# Patient Record
Sex: Male | Born: 1970 | Race: Black or African American | Hispanic: No | Marital: Single | Smoking: Current every day smoker
Health system: Southern US, Community
[De-identification: ages and names within clinical notes are randomized; demographics above are authoritative.]

## PROBLEM LIST (undated history)

## (undated) DIAGNOSIS — J45909 Unspecified asthma, uncomplicated: Secondary | ICD-10-CM

## (undated) DIAGNOSIS — I2510 Atherosclerotic heart disease of native coronary artery without angina pectoris: Secondary | ICD-10-CM

## (undated) DIAGNOSIS — J449 Chronic obstructive pulmonary disease, unspecified: Secondary | ICD-10-CM

## (undated) DIAGNOSIS — K5792 Diverticulitis of intestine, part unspecified, without perforation or abscess without bleeding: Secondary | ICD-10-CM

## (undated) DIAGNOSIS — I10 Essential (primary) hypertension: Secondary | ICD-10-CM

## (undated) HISTORY — DX: Diverticulitis of intestine, part unspecified, without perforation or abscess without bleeding: K57.92

## (undated) HISTORY — DX: Chronic obstructive pulmonary disease, unspecified: J44.9

## (undated) HISTORY — PX: APPENDECTOMY: SHX54

## (undated) HISTORY — DX: Unspecified asthma, uncomplicated: J45.909

## (undated) HISTORY — PX: SHOULDER SURGERY: SHX246

## (undated) HISTORY — DX: Essential (primary) hypertension: I10

## (undated) HISTORY — PX: MANDIBLE SURGERY: SHX707

## (undated) HISTORY — PX: TOTAL KNEE ARTHROPLASTY: SHX125

---

## 2013-09-15 ENCOUNTER — Ambulatory Visit: Payer: No Typology Code available for payment source | Admitting: Physical Therapy

## 2013-12-20 ENCOUNTER — Ambulatory Visit: Payer: No Typology Code available for payment source | Admitting: Cardiology

## 2014-01-31 ENCOUNTER — Ambulatory Visit (INDEPENDENT_AMBULATORY_CARE_PROVIDER_SITE_OTHER): Payer: No Typology Code available for payment source | Admitting: Cardiology

## 2014-01-31 ENCOUNTER — Encounter: Payer: Self-pay | Admitting: Cardiology

## 2014-01-31 VITALS — BP 142/102 | HR 95 | Ht 70.0 in | Wt 258.0 lb

## 2014-01-31 DIAGNOSIS — F17200 Nicotine dependence, unspecified, uncomplicated: Secondary | ICD-10-CM

## 2014-01-31 DIAGNOSIS — I10 Essential (primary) hypertension: Secondary | ICD-10-CM

## 2014-01-31 DIAGNOSIS — R079 Chest pain, unspecified: Secondary | ICD-10-CM

## 2014-01-31 DIAGNOSIS — R002 Palpitations: Secondary | ICD-10-CM | POA: Insufficient documentation

## 2014-01-31 DIAGNOSIS — R072 Precordial pain: Secondary | ICD-10-CM

## 2014-01-31 DIAGNOSIS — Z720 Tobacco use: Secondary | ICD-10-CM | POA: Insufficient documentation

## 2014-01-31 NOTE — Assessment & Plan Note (Signed)
Patient needs weight loss.

## 2014-01-31 NOTE — Progress Notes (Signed)
     HPI: 43 year old male for evaluation of chest pain and arrhythmia. Nuclear study in August of 2014 at [facility] showed no ischemia. Ejection fraction 41%. Patient has had occasional chest pain for several years. The pain is described as a burning sensation that occurs with more vigorous activities that not routine activities. He has some dyspnea on exertion but no orthopnea, PND or pedal edema. He occasionally has brief fluttering sensation. In June he apparently developed sudden onset of sustained palpitations with a heart rate of 200 by his report. EMS was called and he was admitted to [facility]. I do not have those records available. He had multiple tests but does not know the results of any. He does not recall the term atrial fibrillation or SVT. He has done well since discharge.  Current Outpatient Prescriptions  Medication Sig Dispense Refill  . aspirin 325 MG tablet Take 325 mg by mouth daily.      . Calcium Carbonate Antacid (ANTACID PO) Take by mouth daily.      Marland Kitchen. HYDROcodone-acetaminophen (NORCO) 10-325 MG per tablet Take 1 tablet by mouth every 6 (six) hours as needed.      Marland Kitchen. losartan (COZAAR) 100 MG tablet Take 100 mg by mouth daily.      . metoprolol tartrate (LOPRESSOR) 25 MG tablet Take 12.5 mg by mouth 2 (two) times daily.       No current facility-administered medications for this visit.    No Known Allergies   Past Medical History  Diagnosis Date  . Hypertension   . Asthma   . Diverticulitis   . COPD (chronic obstructive pulmonary disease)     Past Surgical History  Procedure Laterality Date  . Total knee arthroplasty Right   . Shoulder surgery    . Appendectomy    . Mandible surgery      History   Social History  . Marital Status: Single    Spouse Name: N/A    Number of Children: 4  . Years of Education: N/A   Occupational History  . Not on file.   Social History Main Topics  . Smoking status: Current Every Day Smoker  .  Smokeless tobacco: Not on file  . Alcohol Use: No  . Drug Use: No  . Sexual Activity: Not on file   Other Topics Concern  . Not on file   Social History Narrative  . No narrative on file    Family History  Problem Relation Age of Onset  . Heart attack Father   . Stroke Father     3    ROS: no fevers or chills, productive cough, hemoptysis, dysphasia, odynophagia, melena, hematochezia, dysuria, hematuria, rash, seizure activity, orthopnea, PND, pedal edema, claudication. Remaining systems are negative.  Physical Exam:   Blood pressure 142/102, pulse 95, height 5\' 10"  (1.778 m), weight 258 lb (117.028 kg).  General:  Well developed/obese in NAD Skin warm/dry Patient not depressed No peripheral clubbing Back-normal HEENT-normal/normal eyelids Neck supple/normal carotid upstroke bilaterally; no bruits; no JVD; no thyromegaly chest - CTA/ normal expansion CV - RRR/normal S1 and S2; no murmurs, rubs or gallops;  PMI nondisplaced Abdomen -NT/ND, no HSM, no mass, + bowel sounds, no bruit 2+ femoral pulses, no bruits Ext-no edema, chords, 2+ DP Neuro-grossly nonfocal  ECG Sinus rhythm at a rate of 95. No ST changes.

## 2014-01-31 NOTE — Assessment & Plan Note (Signed)
Patient counseled on discontinuing. 

## 2014-01-31 NOTE — Assessment & Plan Note (Signed)
Patient's blood pressure is elevated. I have asked him to contact us with a list of medications. We will increase based on what he is taking. I have asked him to follow his blood pressure at home.

## 2014-01-31 NOTE — Assessment & Plan Note (Signed)
Patient has exertional chest pain. I do have records of a negative nuclear study one year ago. I will await records from [facility] concerning recent evaluation. May require cardiac catheterization in the future. Continue aspirin and beta blocker.

## 2014-01-31 NOTE — Patient Instructions (Signed)
Your physician recommends that you schedule a follow-up appointment in: 4 WEEKS WITH DR CRENSHAW  TRACK BLOOD PRESSURE AND BRING THOSE TO THE NEXT APPOINTMENT

## 2014-01-31 NOTE — Assessment & Plan Note (Signed)
Patient recently admitted with what sounds to be potentially atrial fibrillation. However I have no records available. The patient does not know his medications. He does not know what he was told at [facility] about his rhythm disturbance. We will obtain all previous records. I have asked him to call us with a list of medications. We will continue his beta blocker and aspirin for now. Further recommendations based on review of records.

## 2014-02-01 ENCOUNTER — Telehealth: Payer: Self-pay | Admitting: *Deleted

## 2014-02-01 DIAGNOSIS — I10 Essential (primary) hypertension: Secondary | ICD-10-CM

## 2014-02-01 MED ORDER — LISINOPRIL 40 MG PO TABS: 40.0000 mg | ORAL_TABLET | Freq: Every day | ORAL | Status: DC

## 2014-02-01 MED ORDER — HYDROCHLOROTHIAZIDE 12.5 MG PO CAPS: 12.5000 mg | ORAL_CAPSULE | Freq: Every day | ORAL | Status: DC

## 2014-02-01 NOTE — Telephone Encounter (Signed)
Patient medication list reviewed by dr Jens Somcrenshaw, pt instructed to change lisinopril/hctz to lisinopril 40 mg once daily and hctz 12.5 mg once daily. Pt will go to the [place] office in one week for bmp. Patient voiced understanding

## 2014-02-28 ENCOUNTER — Encounter: Payer: Self-pay | Admitting: Cardiology

## 2014-02-28 ENCOUNTER — Ambulatory Visit (INDEPENDENT_AMBULATORY_CARE_PROVIDER_SITE_OTHER): Payer: No Typology Code available for payment source | Admitting: Cardiology

## 2014-02-28 ENCOUNTER — Encounter: Payer: Self-pay | Admitting: *Deleted

## 2014-02-28 ENCOUNTER — Other Ambulatory Visit: Payer: Self-pay | Admitting: Cardiology

## 2014-02-28 VITALS — BP 128/92 | HR 101 | Ht 70.0 in | Wt 269.8 lb

## 2014-02-28 DIAGNOSIS — R072 Precordial pain: Secondary | ICD-10-CM

## 2014-02-28 DIAGNOSIS — I429 Cardiomyopathy, unspecified: Secondary | ICD-10-CM | POA: Insufficient documentation

## 2014-02-28 DIAGNOSIS — R079 Chest pain, unspecified: Secondary | ICD-10-CM

## 2014-02-28 MED ORDER — METOPROLOL TARTRATE 50 MG PO TABS: 50.0000 mg | ORAL_TABLET | Freq: Two times a day (BID) | ORAL | Status: DC

## 2014-02-28 NOTE — Assessment & Plan Note (Signed)
Previous nuclear study at [facility] suggest EF 41%. Continue ARB and beta blocker. Cardiac catheterization to exclude coronary disease as described under chest pain. This will also help reassess LV function.

## 2014-02-28 NOTE — Assessment & Plan Note (Signed)
Patient with history of question SVT. We will continue to try and obtain records from [facility] He has had no further symptoms. Continue beta blocker.

## 2014-02-28 NOTE — Assessment & Plan Note (Signed)
Patient counseled on discontinuing. 

## 2014-02-28 NOTE — Assessment & Plan Note (Addendum)
Diastolic blood pressure mildly elevated. Increase Lopressor to 50 mg by mouth twice a day.

## 2014-02-28 NOTE — Assessment & Plan Note (Signed)
Patient has had intermittent chest pain for over one year. Previously he had a nuclear study that showed reduced LV function but normal perfusion based on outside records. He went to the emergency room at [facility] with chest pain but apparently had negative enzymes. I think definitive evaluation is warranted. Plan outpatient cardiac catheterization. The risks and benefits were discussed and he agrees to proceed. Continue aspirin and statin.

## 2014-02-28 NOTE — Patient Instructions (Addendum)
Your physician has recommended you make the following change in your medication:  1. INCREASE METOPROLOL TO 50 MG TWICE DAILY; NEW RX SENT IN TODAY FOR NEW DOSE  YOU HAVE BEEN SCHEDULED FOR A CATH 03/22/14 9 AM WITH DR. Excell Seltzer  LAB WORK 9/25/151; BMET, CBC W/DIFF, PT/INR  CHEST X-RAY TODAY; PRE CATH  Your physician recommends that you schedule a follow-up appointment in: 6 WEEKS WITH DR. CRENSHAW

## 2014-02-28 NOTE — Progress Notes (Signed)
      HPI: FU chest pain and arrhythmia. Nuclear study in August of 2014 at [facility] showed no ischemia. Ejection fraction 41%. In June 2014 he apparently developed sudden onset of sustained palpitations with a heart rate of 200 by his report. EMS was called and he was admitted to [facility]. I do not have those records available. He had multiple tests but does not know the results of any. He does not recall the term atrial fibrillation or SVT. Since I last saw him, he apparently was seen at [facility] 3 days ago with chest pain. Blood work and chest x-ray were checked and unremarkable. No records available. Patient states he has had a burning chest pain with exertion for approximately one year. His chest pain 3 days ago was substernal with radiation down his left upper extremity. It was not exertional, not pleuritic and not positional. He has had no pain since then. He has had no recurrent palpitations.   Current Outpatient Prescriptions  Medication Sig Dispense Refill  . aspirin 81 MG tablet Take 81 mg by mouth daily.      Marland Kitchen HYDROcodone-acetaminophen (NORCO) 10-325 MG per tablet Take 1 tablet by mouth every 6 (six) hours as needed.      Marland Kitchen losartan (COZAAR) 100 MG tablet Take 100 mg by mouth daily.      Marland Kitchen lovastatin (MEVACOR) 10 MG tablet Take 10 mg by mouth at bedtime.      . metoprolol tartrate (LOPRESSOR) 50 MG tablet Take 1 tablet (50 mg total) by mouth 2 (two) times daily.  60 tablet  11  . pantoprazole (PROTONIX) 20 MG tablet Take 20 mg by mouth daily.       No current facility-administered medications for this visit.     Past Medical History  Diagnosis Date  . Hypertension   . Asthma   . Diverticulitis   . COPD (chronic obstructive pulmonary disease)     Past Surgical History  Procedure Laterality Date  . Total knee arthroplasty Right   . Shoulder surgery    . Appendectomy    . Mandible surgery      History   Social History  . Marital  Status: Single    Spouse Name: N/A    Number of Children: 4  . Years of Education: N/A   Occupational History  . Not on file.   Social History Main Topics  . Smoking status: Current Every Day Smoker  . Smokeless tobacco: Not on file  . Alcohol Use: No  . Drug Use: No  . Sexual Activity: Not on file   Other Topics Concern  . Not on file   Social History Narrative  . No narrative on file    ROS: no fevers or chills, productive cough, hemoptysis, dysphasia, odynophagia, melena, hematochezia, dysuria, hematuria, rash, seizure activity, orthopnea, PND, pedal edema, claudication. Remaining systems are negative.  Physical Exam: Well-developed obese in no acute distress.  Skin is warm and dry.  HEENT is normal.  Neck is supple.  Chest is clear to auscultation with normal expansion.  Cardiovascular exam is regular rate and rhythm.  Abdominal exam nontender or distended. No masses palpated. Extremities show no edema. neuro grossly intact      Electrocardiogram shows sinus rhythm with no ST changes.

## 2014-03-16 ENCOUNTER — Other Ambulatory Visit: Payer: No Typology Code available for payment source

## 2014-03-16 ENCOUNTER — Ambulatory Visit (HOSPITAL_BASED_OUTPATIENT_CLINIC_OR_DEPARTMENT_OTHER)
Admission: RE | Admit: 2014-03-16 | Discharge: 2014-03-16 | Disposition: A | Discharge status: Home / Self Care | Payer: No Typology Code available for payment source | Source: Ambulatory Visit | Attending: Cardiology | Admitting: Cardiology

## 2014-03-16 DIAGNOSIS — R079 Chest pain, unspecified: Secondary | ICD-10-CM

## 2014-03-16 LAB — CBC WITH DIFFERENTIAL/PLATELET
BASOS PCT: 0 % (ref 0–1)
Basophils Absolute: 0 10*3/uL (ref 0.0–0.1)
EOS ABS: 0.1 10*3/uL (ref 0.0–0.7)
Eosinophils Relative: 2 % (ref 0–5)
HEMATOCRIT: 40.6 % (ref 39.0–52.0)
Hemoglobin: 13.7 g/dL (ref 13.0–17.0)
Lymphocytes Relative: 40 % (ref 12–46)
Lymphs Abs: 2.8 10*3/uL (ref 0.7–4.0)
MCH: 25 pg — AB (ref 26.0–34.0)
MCHC: 33.7 g/dL (ref 30.0–36.0)
MCV: 74.1 fL — ABNORMAL LOW (ref 78.0–100.0)
MONO ABS: 0.3 10*3/uL (ref 0.1–1.0)
Monocytes Relative: 5 % (ref 3–12)
Neutro Abs: 3.7 10*3/uL (ref 1.7–7.7)
Neutrophils Relative %: 53 % (ref 43–77)
Platelets: 273 10*3/uL (ref 150–400)
RBC: 5.48 MIL/uL (ref 4.22–5.81)
RDW: 15.9 % — AB (ref 11.5–15.5)
WBC: 6.9 10*3/uL (ref 4.0–10.5)

## 2014-03-17 LAB — BASIC METABOLIC PANEL
BUN: 16 mg/dL (ref 6–23)
CHLORIDE: 104 meq/L (ref 96–112)
CO2: 24 meq/L (ref 19–32)
Calcium: 9.4 mg/dL (ref 8.4–10.5)
Creat: 1.07 mg/dL (ref 0.50–1.35)
GLUCOSE: 95 mg/dL (ref 70–99)
Potassium: 4.2 mEq/L (ref 3.5–5.3)
SODIUM: 139 meq/L (ref 135–145)

## 2014-03-17 LAB — PROTIME-INR
INR: 0.97 (ref <1.50)
Prothrombin Time: 12.9 seconds (ref 11.6–15.2)

## 2014-03-19 ENCOUNTER — Encounter (HOSPITAL_COMMUNITY): Payer: Self-pay | Admitting: Pharmacy Technician

## 2014-03-22 ENCOUNTER — Encounter (HOSPITAL_COMMUNITY)
Admission: RE | Disposition: A | Discharge status: Home / Self Care | Payer: Self-pay | Source: Ambulatory Visit | Attending: Cardiovascular Disease

## 2014-03-22 ENCOUNTER — Ambulatory Visit (HOSPITAL_COMMUNITY)
Admission: RE | Admit: 2014-03-22 | Discharge: 2014-03-22 | Disposition: A | Discharge status: Home / Self Care | Payer: No Typology Code available for payment source | Source: Ambulatory Visit | Attending: Cardiovascular Disease | Admitting: Cardiovascular Disease

## 2014-03-22 DIAGNOSIS — Z720 Tobacco use: Secondary | ICD-10-CM | POA: Insufficient documentation

## 2014-03-22 DIAGNOSIS — Z79899 Other long term (current) drug therapy: Secondary | ICD-10-CM | POA: Insufficient documentation

## 2014-03-22 DIAGNOSIS — I2510 Atherosclerotic heart disease of native coronary artery without angina pectoris: Secondary | ICD-10-CM | POA: Insufficient documentation

## 2014-03-22 DIAGNOSIS — J449 Chronic obstructive pulmonary disease, unspecified: Secondary | ICD-10-CM | POA: Insufficient documentation

## 2014-03-22 DIAGNOSIS — I10 Essential (primary) hypertension: Secondary | ICD-10-CM | POA: Insufficient documentation

## 2014-03-22 DIAGNOSIS — J45909 Unspecified asthma, uncomplicated: Secondary | ICD-10-CM | POA: Insufficient documentation

## 2014-03-22 DIAGNOSIS — Z7982 Long term (current) use of aspirin: Secondary | ICD-10-CM | POA: Insufficient documentation

## 2014-03-22 DIAGNOSIS — R072 Precordial pain: Secondary | ICD-10-CM

## 2014-03-22 HISTORY — PX: LEFT HEART CATHETERIZATION WITH CORONARY ANGIOGRAM: SHX5451

## 2014-03-22 SURGERY — LEFT HEART CATHETERIZATION WITH CORONARY ANGIOGRAM
Anesthesia: LOCAL

## 2014-03-22 MED ORDER — ACETAMINOPHEN 325 MG PO TABS: 650.0000 mg | ORAL_TABLET | ORAL | Status: DC | PRN

## 2014-03-22 MED ORDER — SODIUM CHLORIDE 0.9 % IJ SOLN: 3.0000 mL | Freq: Two times a day (BID) | INTRAMUSCULAR | Status: DC

## 2014-03-22 MED ORDER — LIDOCAINE HCL (PF) 1 % IJ SOLN
INTRAMUSCULAR | Status: AC
  Filled 2014-03-22: qty 30

## 2014-03-22 MED ORDER — FENTANYL CITRATE 0.05 MG/ML IJ SOLN
INTRAMUSCULAR | Status: AC
  Filled 2014-03-22: qty 2

## 2014-03-22 MED ORDER — SODIUM CHLORIDE 0.9 % IV SOLN: 250.0000 mL | INTRAVENOUS | Status: DC | PRN

## 2014-03-22 MED ORDER — SODIUM CHLORIDE 0.9 % IV SOLN: 1.0000 mL/kg/h | INTRAVENOUS | Status: DC

## 2014-03-22 MED ORDER — SODIUM CHLORIDE 0.9 % IJ SOLN: 3.0000 mL | INTRAMUSCULAR | Status: DC | PRN

## 2014-03-22 MED ORDER — ONDANSETRON HCL 4 MG/2ML IJ SOLN: 4.0000 mg | Freq: Four times a day (QID) | INTRAMUSCULAR | Status: DC | PRN

## 2014-03-22 MED ORDER — HEPARIN SODIUM (PORCINE) 1000 UNIT/ML IJ SOLN
INTRAMUSCULAR | Status: AC
  Filled 2014-03-22: qty 1

## 2014-03-22 MED ORDER — VERAPAMIL HCL 2.5 MG/ML IV SOLN
INTRAVENOUS | Status: AC
  Filled 2014-03-22: qty 2

## 2014-03-22 MED ORDER — SODIUM CHLORIDE 0.9 % IV SOLN
1.0000 mL/kg/h | INTRAVENOUS | Status: DC
  Administered 2014-03-22: 1 mL/kg/h via INTRAVENOUS

## 2014-03-22 MED ORDER — ASPIRIN 81 MG PO CHEW
81.0000 mg | CHEWABLE_TABLET | ORAL | Status: AC
  Administered 2014-03-22: 81 mg via ORAL

## 2014-03-22 MED ORDER — MIDAZOLAM HCL 2 MG/2ML IJ SOLN
INTRAMUSCULAR | Status: AC
  Filled 2014-03-22: qty 2

## 2014-03-22 MED ORDER — HEPARIN (PORCINE) IN NACL 2-0.9 UNIT/ML-% IJ SOLN
INTRAMUSCULAR | Status: AC
  Filled 2014-03-22: qty 500

## 2014-03-22 MED ORDER — ASPIRIN 81 MG PO CHEW
CHEWABLE_TABLET | ORAL | Status: AC
  Filled 2014-03-22: qty 1

## 2014-03-22 NOTE — Interval H&P Note (Signed)
History and Physical Interval Note:  03/22/2014 7:33 AM  Margit BandaJames Lewis Lempke  has presented today for surgery, with the diagnosis of cp  The various methods of treatment have been discussed with the patient and family. After consideration of risks, benefits and other options for treatment, the patient has consented to  Procedure(s): LEFT HEART CATHETERIZATION WITH CORONARY ANGIOGRAM (N/A) as a surgical intervention .  The patient's history has been reviewed, patient examined, no change in status, stable for surgery.  I have reviewed the patient's chart and labs.  Questions were answered to the patient's satisfaction.    Cath Lab Visit (complete for each Cath Lab visit)  Clinical Evaluation Leading to the Procedure:   ACS: No.  Non-ACS:    Anginal Classification: CCS III  Anti-ischemic medical therapy: Minimal Therapy (1 class of medications)  Non-Invasive Test Results: No non-invasive testing performed  Prior CABG: No previous CABG        Tonny BollmanMichael Sevanna Ballengee

## 2014-03-22 NOTE — CV Procedure (Signed)
    Cardiac Catheterization Procedure Note  Name: Adam Moses MRN: [id number] DOB: 1971/02/20  Procedure: Left Heart Cath, Selective Coronary Angiography, LV angiography  Indication: Chest pain, multiple CV risk factors   Procedural Details: The right wrist was prepped, draped, and anesthetized with 1% lidocaine. Using the modified Seldinger technique, a 5/6 French Slender sheath was introduced into the right radial artery. 3 mg of verapamil was administered through the sheath, weight-based unfractionated heparin was administered intravenously. Standard Judkins catheters were used for selective coronary angiography and left ventriculography. Catheter exchanges were performed over an exchange length guidewire. There were no immediate procedural complications. A TR band was used for radial hemostasis at the completion of the procedure.  The patient was transferred to the post catheterization recovery area for further monitoring.  Procedural Findings: Hemodynamics: AO 118/77 LV 109/8  Coronary angiography: Coronary dominance: right  Left mainstem: The left main is patent. There is no obstructive disease.  Left anterior descending (LAD): The LAD is patent. There are minor irregularities but no significant stenoses noted. The vessel wraps around the LV apex the  Left circumflex (LCx): The intermediate branch is patent. There are diffuse irregularities. The vessel divides into twin vessels in its midportion. The AV circumflex is small.  Right coronary artery (RCA): The RCA has diffuse proximal irregularity with about 30% stenosis. The mid and distal vessel are widely patent. The PDA and PLA branches are small with minor diffuse  Left ventriculography: Left ventricular systolic function is low-normal, LVEF is estimated at 50-55 %, there is no significant mitral regurgitation   Estimated Blood Loss: Minimal  Final Conclusions:   1. Nonobstructive coronary artery disease 2. Low normal  LV systolic function  Recommendations:  Suspect noncardiac chest pain. However, the patient does have coronary atherosclerosis at age 43. He will require aggressive risk reduction and at a minimum would recommend aspirin, a statin drug, and we discussed tobacco cessation.  Tonny BollmanMichael Kiele Heavrin MD, [facility] 03/22/2014, 10:07 AM

## 2014-03-22 NOTE — H&P (View-Only) (Signed)
      HPI: FU chest pain and arrhythmia. Nuclear study in August of 2014 at [facility] showed no ischemia. Ejection fraction 41%. In June 2014 he apparently developed sudden onset of sustained palpitations with a heart rate of 200 by his report. EMS was called and he was admitted to [facility]. I do not have those records available. He had multiple tests but does not know the results of any. He does not recall the term atrial fibrillation or SVT. Since I last saw him, he apparently was seen [facility] 3 days ago with chest pain. Blood work and chest x-ray were checked and unremarkable. No records available. Patient states he has had a burning chest pain with exertion for approximately one year. His chest pain 3 days ago was substernal with radiation down his left upper extremity. It was not exertional, not pleuritic and not positional. He has had no pain since then. He has had no recurrent palpitations.   Current Outpatient Prescriptions  Medication Sig Dispense Refill  . aspirin 81 MG tablet Take 81 mg by mouth daily.      Marland Kitchen. HYDROcodone-acetaminophen (NORCO) 10-325 MG per tablet Take 1 tablet by mouth every 6 (six) hours as needed.      Marland Kitchen. losartan (COZAAR) 100 MG tablet Take 100 mg by mouth daily.      Marland Kitchen. lovastatin (MEVACOR) 10 MG tablet Take 10 mg by mouth at bedtime.      . metoprolol tartrate (LOPRESSOR) 50 MG tablet Take 1 tablet (50 mg total) by mouth 2 (two) times daily.  60 tablet  11  . pantoprazole (PROTONIX) 20 MG tablet Take 20 mg by mouth daily.       No current facility-administered medications for this visit.     Past Medical History  Diagnosis Date  . Hypertension   . Asthma   . Diverticulitis   . COPD (chronic obstructive pulmonary disease)     Past Surgical History  Procedure Laterality Date  . Total knee arthroplasty Right   . Shoulder surgery    . Appendectomy    . Mandible surgery      History   Social History  . Marital  Status: Single    Spouse Name: N/A    Number of Children: 4  . Years of Education: N/A   Occupational History  . Not on file.   Social History Main Topics  . Smoking status: Current Every Day Smoker  . Smokeless tobacco: Not on file  . Alcohol Use: No  . Drug Use: No  . Sexual Activity: Not on file   Other Topics Concern  . Not on file   Social History Narrative  . No narrative on file    ROS: no fevers or chills, productive cough, hemoptysis, dysphasia, odynophagia, melena, hematochezia, dysuria, hematuria, rash, seizure activity, orthopnea, PND, pedal edema, claudication. Remaining systems are negative.  Physical Exam: Well-developed obese in no acute distress.  Skin is warm and dry.  HEENT is normal.  Neck is supple.  Chest is clear to auscultation with normal expansion.  Cardiovascular exam is regular rate and rhythm.  Abdominal exam nontender or distended. No masses palpated. Extremities show no edema. neuro grossly intact      Electrocardiogram shows sinus rhythm with no ST changes.

## 2014-03-22 NOTE — Discharge Instructions (Signed)
Radial Site Care °Refer to this sheet in the next few weeks. These instructions provide you with information on caring for yourself after your procedure. Your caregiver may also give you more specific instructions. Your treatment has been planned according to current medical practices, but problems sometimes occur. Call your caregiver if you have any problems or questions after your procedure. °HOME CARE INSTRUCTIONS °· You may shower the day after the procedure. Remove the bandage (dressing) and gently wash the site with plain soap and water. Gently pat the site dry. °· Do not apply powder or lotion to the site. °· Do not submerge the affected site in water for 3 to 5 days. °· Inspect the site at least twice daily. °· Do not flex or bend the affected arm for 24 hours. °· No lifting over 5 pounds (2.3 kg) for 5 days after your procedure. °· Do not drive home if you are discharged the same day of the procedure. Have someone else drive you. °· You may drive 24 hours after the procedure unless otherwise instructed by your caregiver. °· Do not operate machinery or power tools for 24 hours. °· A responsible adult should be with you for the first 24 hours after you arrive home. °What to expect: °· Any bruising will usually fade within 1 to 2 weeks. °· Blood that collects in the tissue (hematoma) may be painful to the touch. It should usually decrease in size and tenderness within 1 to 2 weeks. °SEEK IMMEDIATE MEDICAL CARE IF: °· You have unusual pain at the radial site. °· You have redness, warmth, swelling, or pain at the radial site. °· You have drainage (other than a small amount of blood on the dressing). °· You have chills. °· You have a fever or persistent symptoms for more than 72 hours. °· You have a fever and your symptoms suddenly get worse. °· Your arm becomes pale, cool, tingly, or numb. °· You have heavy bleeding from the site. Hold pressure on the site. °Document Released: 07/11/2010 Document Revised:  08/31/2011 Document Reviewed: 07/11/2010 °ExitCare® Patient Information ©2015 [employer]. This information is not intended to replace advice given to you by your health care provider. Make sure you discuss any questions you have with your health care provider. ° °

## 2014-04-02 ENCOUNTER — Telehealth: Payer: Self-pay | Admitting: Cardiology

## 2014-04-02 NOTE — Telephone Encounter (Signed)
Records rec From [facility], Sent [facility] to [employer]Debra Mathis   10.12.15./km

## 2014-04-11 ENCOUNTER — Encounter: Payer: Self-pay | Admitting: Cardiology

## 2014-04-11 ENCOUNTER — Ambulatory Visit (INDEPENDENT_AMBULATORY_CARE_PROVIDER_SITE_OTHER): Payer: No Typology Code available for payment source | Admitting: Cardiology

## 2014-04-11 VITALS — BP 150/100 | HR 75 | Ht 70.0 in | Wt 273.0 lb

## 2014-04-11 DIAGNOSIS — E785 Hyperlipidemia, unspecified: Secondary | ICD-10-CM

## 2014-04-11 DIAGNOSIS — I2583 Coronary atherosclerosis due to lipid rich plaque: Principal | ICD-10-CM

## 2014-04-11 DIAGNOSIS — I471 Supraventricular tachycardia, unspecified: Secondary | ICD-10-CM

## 2014-04-11 DIAGNOSIS — I10 Essential (primary) hypertension: Secondary | ICD-10-CM

## 2014-04-11 DIAGNOSIS — Z720 Tobacco use: Secondary | ICD-10-CM

## 2014-04-11 DIAGNOSIS — I2510 Atherosclerotic heart disease of native coronary artery without angina pectoris: Secondary | ICD-10-CM

## 2014-04-11 MED ORDER — PANTOPRAZOLE SODIUM 20 MG PO TBEC: 20.0000 mg | DELAYED_RELEASE_TABLET | Freq: Every day | ORAL | Status: DC

## 2014-04-11 MED ORDER — ATORVASTATIN CALCIUM 40 MG PO TABS
40.0000 mg | ORAL_TABLET | Freq: Every day | ORAL | Status: AC
Start: 2014-04-11 — End: 2015-04-11

## 2014-04-11 MED ORDER — METOPROLOL TARTRATE 50 MG PO TABS: 50.0000 mg | ORAL_TABLET | Freq: Two times a day (BID) | ORAL | Status: DC

## 2014-04-11 MED ORDER — HYDROCHLOROTHIAZIDE 12.5 MG PO CAPS: 12.5000 mg | ORAL_CAPSULE | Freq: Every day | ORAL | Status: DC

## 2014-04-11 NOTE — Assessment & Plan Note (Signed)
Patient is noted to have plaque in his coronaries on catheterization. Discontinue Mevacor. Begin Lipitor 40 mg daily. Check lipids and liver in 4 weeks.

## 2014-04-11 NOTE — Assessment & Plan Note (Signed)
Patient sounds to be having episodes of SVT requiring adenosine. I do not have records available from [facility]. I will obtain rhythm strips and records. If SVT is documented he would like definitive therapy. We will refer to electrophysiology consideration of ablation. Continue beta blocker.

## 2014-04-11 NOTE — Assessment & Plan Note (Signed)
Patient counseled on weight loss. 

## 2014-04-11 NOTE — Patient Instructions (Signed)
Your physician recommends that you schedule a follow-up appointment in: 3 MONTHS WITH DR CRENSHAW  STOP LOVASTATIN  START ATORVASTATIN 40 MG ONCE DAILY  Your physician recommends that you return for lab work in: 4 WEEKS = DO NOT EAT PRIOR TO LAB WORK  INCREASE METOPROLOL TO 50 MG TWICE DAILY= 2 OF THE 25 MG TABLETS TWICE DAILY  START HCTZ 12.5 MG ONCE DAILY  Your physician recommends that you return for lab work in: ONE WEEK=BMP

## 2014-04-11 NOTE — Progress Notes (Signed)
      HPI: FU chest pain and arrhythmia. Nuclear study in August of 2014 at [facility] showed no ischemia. Ejection fraction 41%. In June 2014 he apparently developed sudden onset of sustained palpitations with a heart rate of 200 by his report. EMS was called and he was admitted to [facility]. I do not have those records available. He had multiple tests but does not know the results of any. He does not recall the term atrial fibrillation or SVT. The patient continued to have chest pain. Cardiac catheterization in October 2015 showed irregularities but no triple coronary disease. Ejection fraction 50-55%. Since last seen, He denies dyspnea on exertion, orthopnea, PND, pedal edema or syncope. Occasional indigestion but no other chest pain. He had what sounds to be another episode of SVT. He describes sudden onset of heart racing with a heart rate of 220. He was seen at [facility] and sounds to have been given adenosine. He converted. I do not have those records available.   Current Outpatient Prescriptions  Medication Sig Dispense Refill  . aspirin EC 81 MG tablet Take 81 mg by mouth daily.      Marland Kitchen. HYDROcodone-acetaminophen (NORCO) 10-325 MG per tablet Take 1 tablet by mouth every 6 (six) hours as needed (pain).       Marland Kitchen. losartan (COZAAR) 100 MG tablet Take 100 mg by mouth daily.      Marland Kitchen. lovastatin (MEVACOR) 10 MG tablet Take 10 mg by mouth at bedtime.      . metoprolol tartrate (LOPRESSOR) 25 MG tablet Take 25 mg by mouth 2 (two) times daily.      . pantoprazole (PROTONIX) 20 MG tablet Take 20 mg by mouth daily.       No current facility-administered medications for this visit.     Past Medical History  Diagnosis Date  . Hypertension   . Asthma   . Diverticulitis   . COPD (chronic obstructive pulmonary disease)     Past Surgical History  Procedure Laterality Date  . Total knee arthroplasty Right   . Shoulder surgery    . Appendectomy    . Mandible surgery       History   Social History  . Marital Status: Single    Spouse Name: N/A    Number of Children: 4  . Years of Education: N/A   Occupational History  . Not on file.   Social History Main Topics  . Smoking status: Current Every Day Smoker  . Smokeless tobacco: Not on file  . Alcohol Use: No  . Drug Use: No  . Sexual Activity: Not on file   Other Topics Concern  . Not on file   Social History Narrative  . No narrative on file    ROS: no fevers or chills, productive cough, hemoptysis, dysphasia, odynophagia, melena, hematochezia, dysuria, hematuria, rash, seizure activity, orthopnea, PND, pedal edema, claudication. Remaining systems are negative.  Physical Exam: Well-developed obese in no acute distress.  Skin is warm and dry.  HEENT is normal.  Neck is supple.  Chest is clear to auscultation with normal expansion.  Cardiovascular exam is regular rate and rhythm.  Abdominal exam nontender or distended. No masses palpated. Extremities show no edema. neuro grossly intact

## 2014-04-11 NOTE — Assessment & Plan Note (Signed)
Patient counseled on discontinuing. 

## 2014-04-11 NOTE — Assessment & Plan Note (Signed)
Blood pressure is elevated. Add HCTZ 12.5 mg daily and increase metoprolol to 50 mg by mouth twice a day. Check potassium and renal function in one week.

## 2014-04-11 NOTE — Assessment & Plan Note (Signed)
Patient has mild coronary disease on recent catheterization. Continue aspirin and statin. Discussed lifestyle modification.

## 2014-04-18 LAB — BASIC METABOLIC PANEL WITH GFR
BUN: 14 mg/dL (ref 6–23)
CALCIUM: 9.3 mg/dL (ref 8.4–10.5)
CO2: 24 mEq/L (ref 19–32)
Chloride: 106 mEq/L (ref 96–112)
Creat: 1.02 mg/dL (ref 0.50–1.35)
GFR, Est Non African American: 88 mL/min
GLUCOSE: 106 mg/dL — AB (ref 70–99)
POTASSIUM: 4.1 meq/L (ref 3.5–5.3)
SODIUM: 140 meq/L (ref 135–145)

## 2014-04-18 LAB — LIPID PANEL
CHOL/HDL RATIO: 4.4 ratio
CHOLESTEROL: 133 mg/dL (ref 0–200)
HDL: 30 mg/dL — AB (ref >39)
LDL Cholesterol: 81 mg/dL (ref 0–99)
Triglycerides: 110 mg/dL (ref <150)
VLDL: 22 mg/dL (ref 0–40)

## 2014-04-18 LAB — HEPATIC FUNCTION PANEL
ALBUMIN: 4.2 g/dL (ref 3.5–5.2)
ALT: 19 U/L (ref 0–53)
AST: 17 U/L (ref 0–37)
Alkaline Phosphatase: 41 U/L (ref 39–117)
BILIRUBIN DIRECT: 0.1 mg/dL (ref 0.0–0.3)
Indirect Bilirubin: 0.2 mg/dL (ref 0.2–1.2)
Total Bilirubin: 0.3 mg/dL (ref 0.2–1.2)
Total Protein: 7.5 g/dL (ref 6.0–8.3)

## 2014-04-19 ENCOUNTER — Encounter: Payer: Self-pay | Admitting: *Deleted

## 2014-05-31 ENCOUNTER — Encounter (HOSPITAL_COMMUNITY): Payer: Self-pay | Admitting: Cardiovascular Disease

## 2014-06-01 ENCOUNTER — Encounter: Payer: Self-pay | Admitting: *Deleted

## 2014-07-18 ENCOUNTER — Ambulatory Visit (INDEPENDENT_AMBULATORY_CARE_PROVIDER_SITE_OTHER): Payer: No Typology Code available for payment source | Admitting: Cardiology

## 2014-07-18 ENCOUNTER — Encounter: Payer: Self-pay | Admitting: Cardiology

## 2014-07-18 VITALS — BP 122/90 | HR 81 | Ht 70.0 in | Wt 272.0 lb

## 2014-07-18 DIAGNOSIS — I2583 Coronary atherosclerosis due to lipid rich plaque: Principal | ICD-10-CM

## 2014-07-18 DIAGNOSIS — I2510 Atherosclerotic heart disease of native coronary artery without angina pectoris: Secondary | ICD-10-CM

## 2014-07-18 DIAGNOSIS — Z720 Tobacco use: Secondary | ICD-10-CM

## 2014-07-18 DIAGNOSIS — E785 Hyperlipidemia, unspecified: Secondary | ICD-10-CM

## 2014-07-18 DIAGNOSIS — I10 Essential (primary) hypertension: Secondary | ICD-10-CM

## 2014-07-18 DIAGNOSIS — I471 Supraventricular tachycardia, unspecified: Secondary | ICD-10-CM

## 2014-07-18 NOTE — Assessment & Plan Note (Signed)
Long discussion today concerning options. We did obtain his electro[facility]ogram from [facility] demonstrating his previous SVT. 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 His symptoms have improved with higher dose metoprolol. He would like to continue conservative measures if possible. We will therefore continue his beta blocker. If he has breakthrough episodes in the future we will refer to electrophysiology for consideration of ablation.

## 2014-07-18 NOTE — Patient Instructions (Signed)
Your physician wants you to follow-up in: 6 MONTHS WITH DR CRENSHAW You will receive a reminder letter in the mail two months in advance. If you don't receive a letter, please call our office to schedule the follow-up appointment.  

## 2014-07-18 NOTE — Assessment & Plan Note (Signed)
Patient counseled on discontinuing. 

## 2014-07-18 NOTE — Progress Notes (Signed)
      HPI: FU SVT. Nuclear study in August of 2014 at [facility] showed no ischemia. Ejection fraction 41%. Patient has previously been treated with adenosine for SVT at [facility]. Echocardiogram April 2015 showed normal LV function and mild left ventricular hypertrophy. The patient continued to have chest pain. Cardiac catheterization in October 2015 showed irregularities but no significant coronary disease. Ejection fraction 50-55%. Since last seen, he has an occasional episode where he feels short of breath for 1-2 seconds and mild dyspnea on exertion. No orthopnea, PND, pedal edema, exertional chest pain or syncope. No SVT since last seen.  Current Outpatient Prescriptions  Medication Sig Dispense Refill  . aspirin EC 81 MG tablet Take 81 mg by mouth daily.    Marland Kitchen. atorvastatin (LIPITOR) 40 MG tablet Take 1 tablet (40 mg total) by mouth daily. 30 tablet 11  . HYDROcodone-acetaminophen (NORCO) 10-325 MG per tablet Take 1 tablet by mouth every 6 (six) hours as needed (pain).     Marland Kitchen. losartan (COZAAR) 100 MG tablet Take 100 mg by mouth daily.    . metoprolol tartrate (LOPRESSOR) 50 MG tablet Take 1 tablet (50 mg total) by mouth 2 (two) times daily. 60 tablet 11  . pantoprazole (PROTONIX) 20 MG tablet Take 1 tablet (20 mg total) by mouth daily. 30 tablet 11   No current facility-administered medications for this visit.     Past Medical History  Diagnosis Date  . Hypertension   . Asthma   . Diverticulitis   . COPD (chronic obstructive pulmonary disease)     Past Surgical History  Procedure Laterality Date  . Total knee arthroplasty Right   . Shoulder surgery    . Appendectomy    . Mandible surgery    . Left heart catheterization with coronary angiogram N/A 03/22/2014    Procedure: LEFT HEART CATHETERIZATION WITH CORONARY ANGIOGRAM;  Surgeon: Micheline ChapmanMichael D Cooper, MD;  Location: [facility] CATH LAB;  Service: Cardiovascular;  Laterality: N/A;    History   Social History  . Marital  Status: Single    Spouse Name: N/A    Number of Children: 4  . Years of Education: N/A   Occupational History  . Not on file.   Social History Main Topics  . Smoking status: Current Every Day Smoker  . Smokeless tobacco: Not on file  . Alcohol Use: No  . Drug Use: No  . Sexual Activity: Not on file   Other Topics Concern  . Not on file   Social History Narrative    ROS: no fevers or chills, productive cough, hemoptysis, dysphasia, odynophagia, melena, hematochezia, dysuria, hematuria, rash, seizure activity, orthopnea, PND, pedal edema, claudication. Remaining systems are negative.  Physical Exam: Well-developed well-nourished in no acute distress.  Skin is warm and dry.  HEENT is normal.  Neck is supple.  Chest is clear to auscultation with normal expansion.  Cardiovascular exam is regular rate and rhythm.  Abdominal exam nontender or distended. No masses palpated. Extremities show no edema. neuro grossly intact

## 2014-07-18 NOTE — Assessment & Plan Note (Signed)
Continue statin. 

## 2014-07-18 NOTE — Assessment & Plan Note (Signed)
Blood pressure controlled. Continue present medications. 

## 2014-07-18 NOTE — Assessment & Plan Note (Signed)
Mild on previous catheterization. Continue aspirin and statin. 

## 2014-11-07 IMAGING — CR DG CHEST 2V
2 series · 2 of 2 positions shown · non-contrast
Comparison: 03/15/14

CLINICAL DATA: Mid sternal chest pain.

EXAM:
CHEST  2 VIEW

[w chest pa]
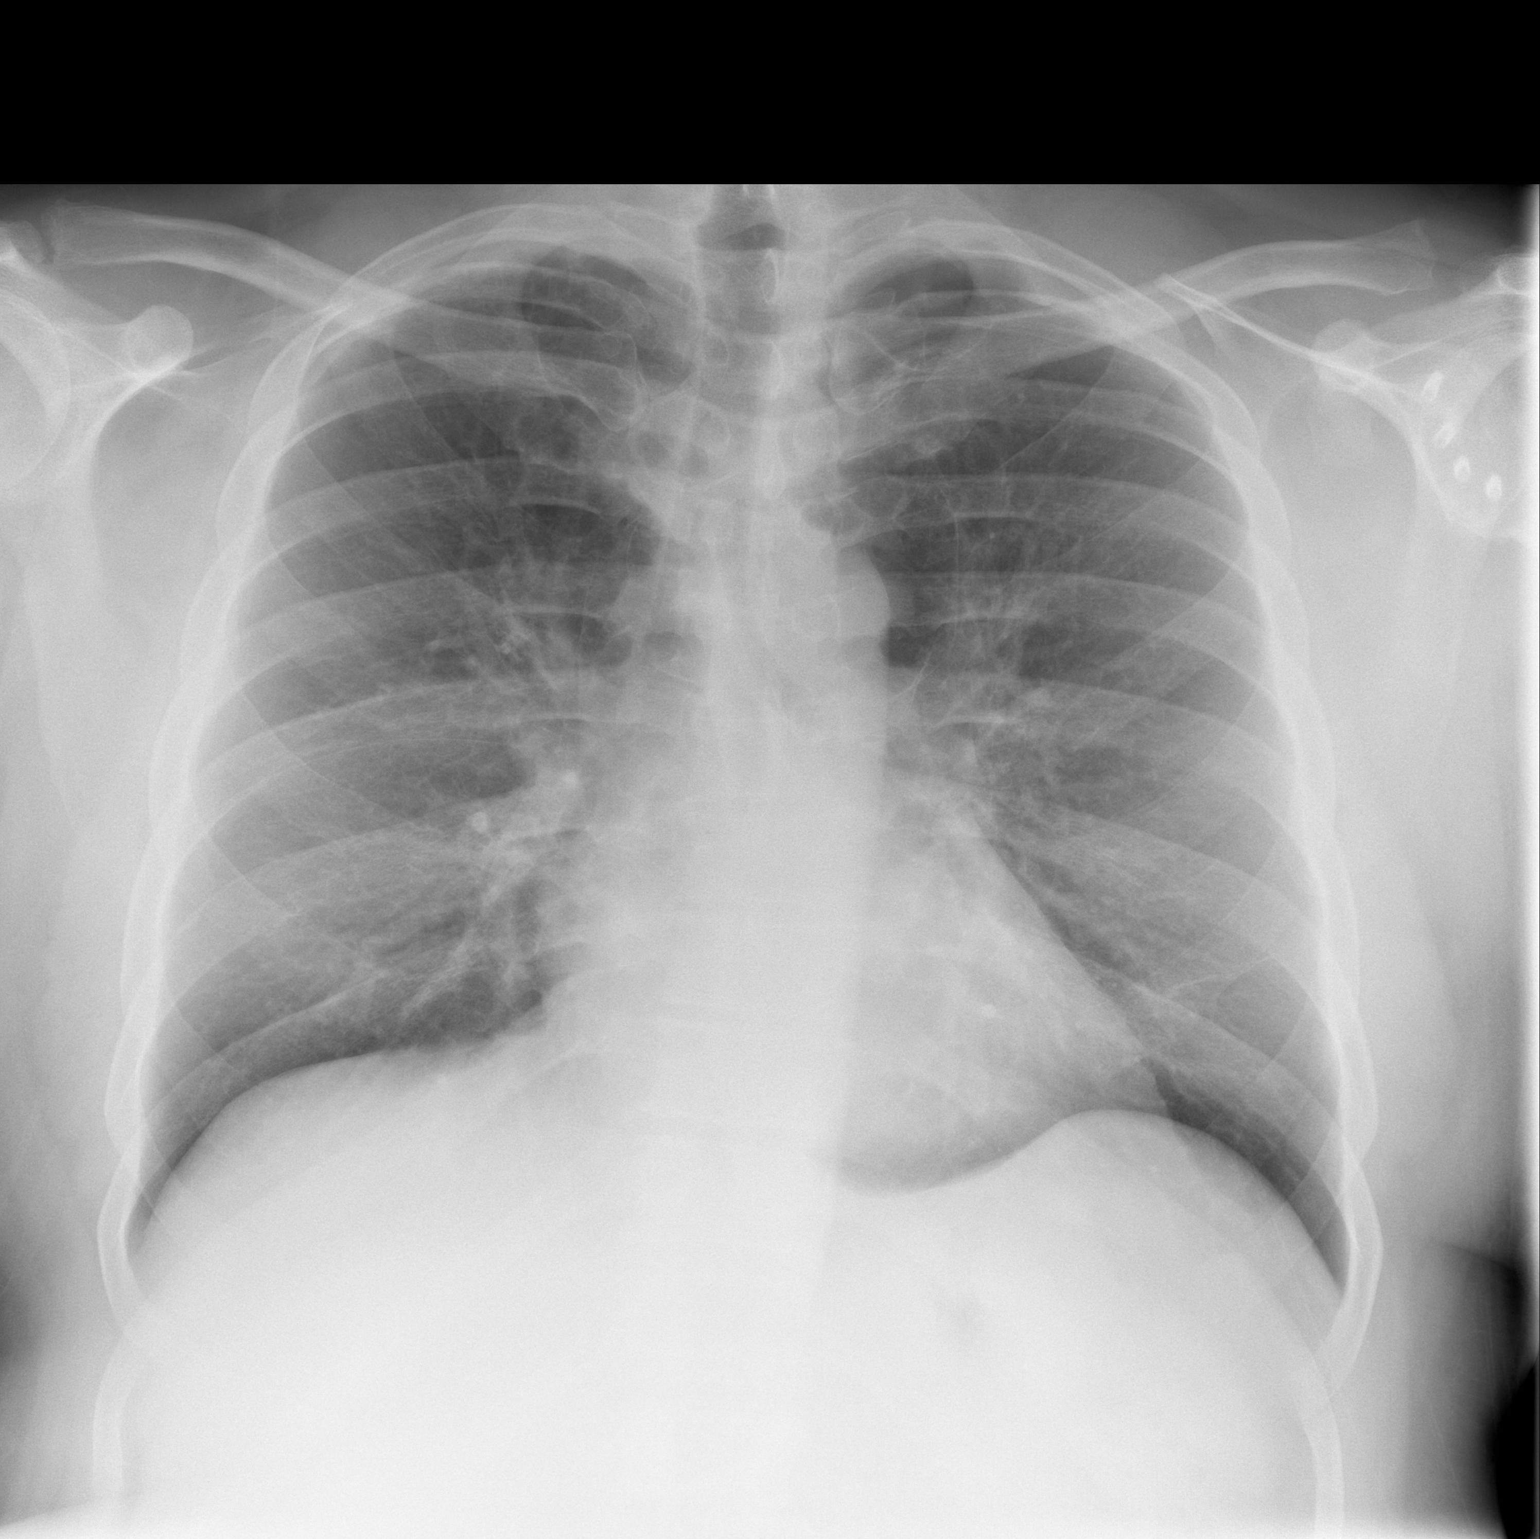

[w chest lat]
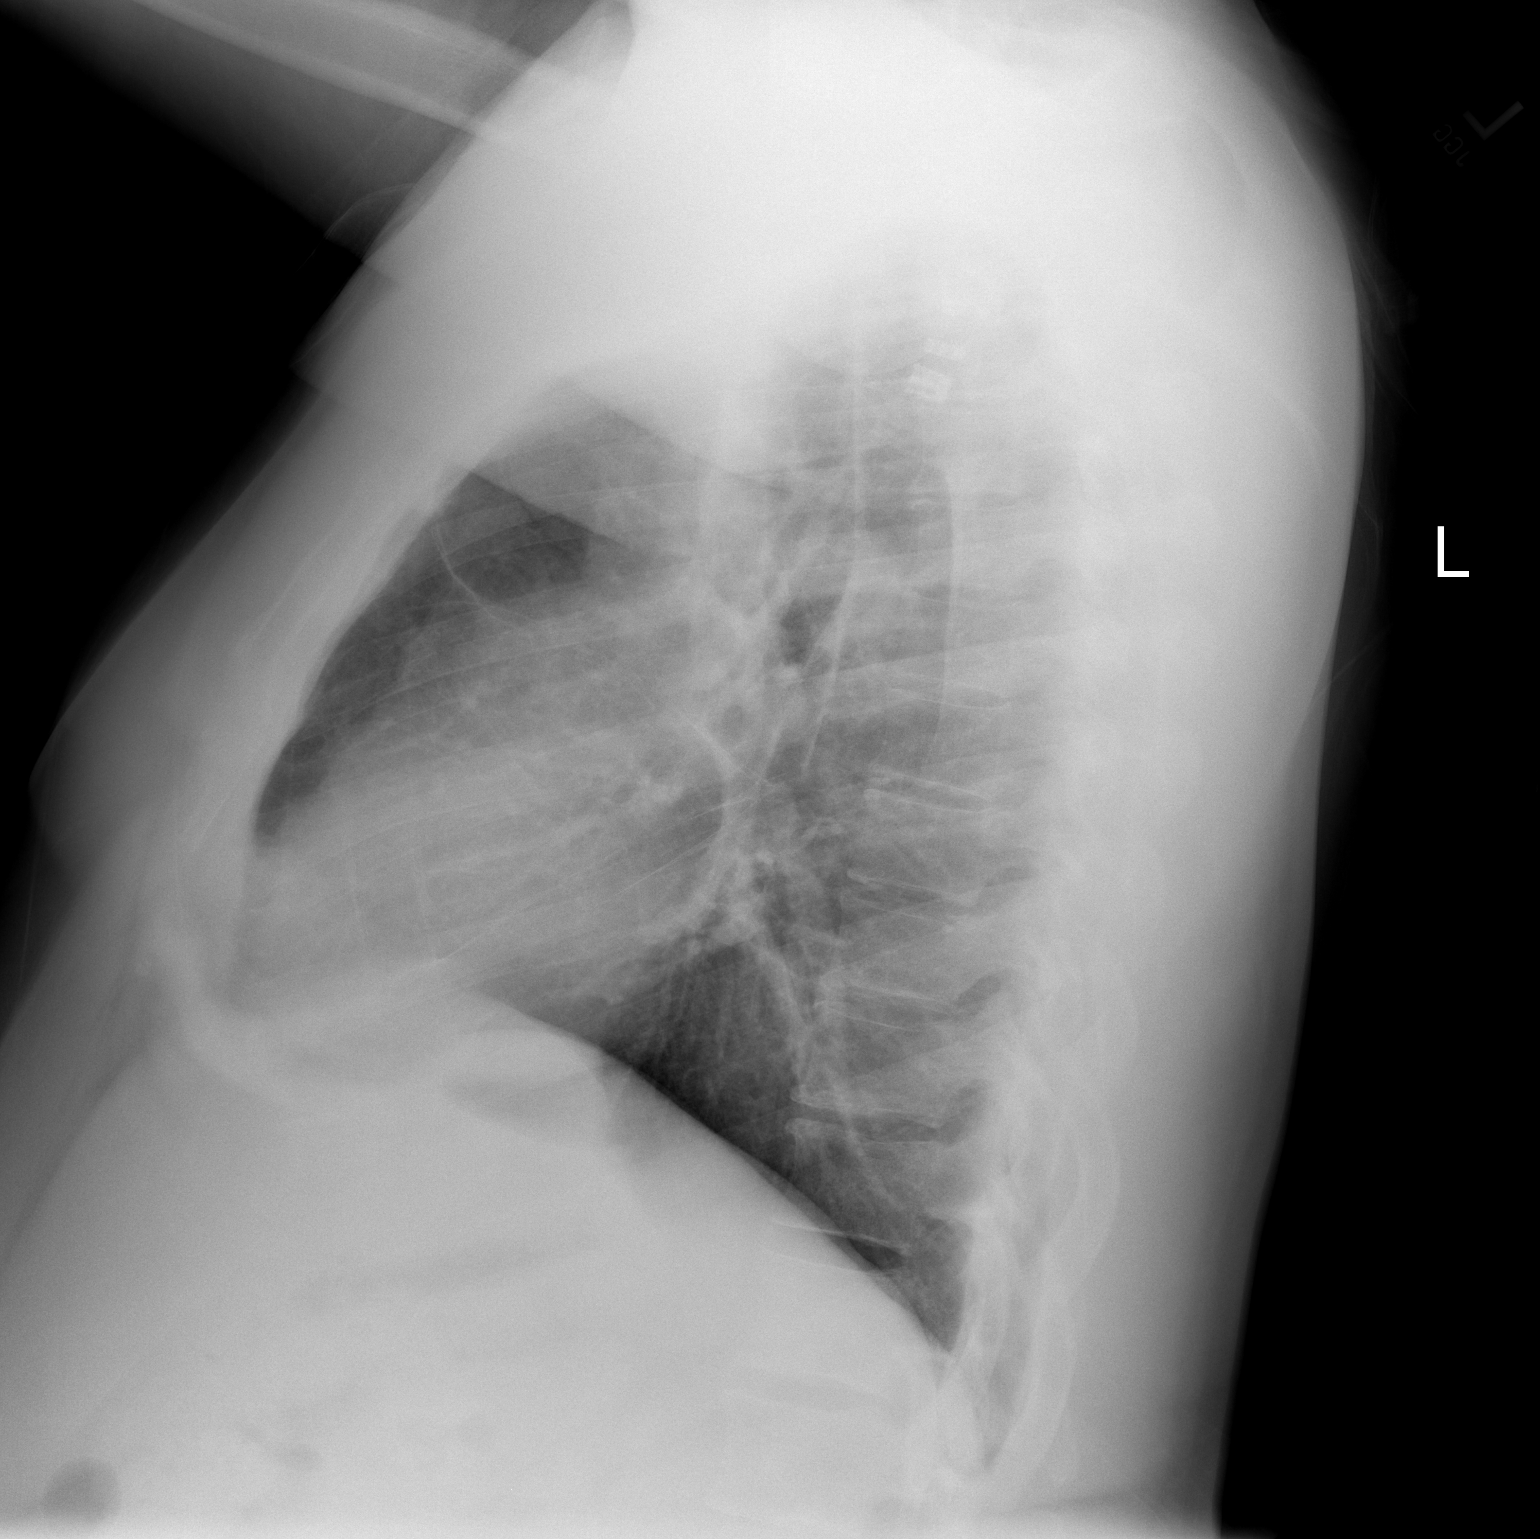

[2 of 2 positions shown; findings below may reference images not displayed]

FINDINGS: The heart size and mediastinal contours are within normal limits.
Both lungs are clear. Mild thoracic spondylosis noted.
IMPRESSION: 1. No acute cardiopulmonary abnormalities.

## 2015-07-23 NOTE — Progress Notes (Signed)
      HPI:  FU SVT. Nuclear study in August of 2014 at [facility] showed no ischemia. Ejection fraction 41%. Patient has previously been treated with adenosine for SVT at [facility]. Echocardiogram April 2015 showed normal LV function and mild left ventricular hypertrophy. The patient continued to have chest pain. Cardiac catheterization in October 2015 showed irregularities but no significant coronary disease. Ejection fraction 50-55%. Since last seen, He has some dyspnea on exertion attributed to asthma. No orthopnea, PND, pedal edema or exertional chest pain. Occasional brief flutters but no sustained palpitations.  Current Outpatient Prescriptions  Medication Sig Dispense Refill  . aspirin EC 81 MG tablet Take 81 mg by mouth daily.    Marland Kitchen esomeprazole (NEXIUM) 20 MG capsule Take 40 mg by mouth daily at 12 noon.    Marland Kitchen losartan (COZAAR) 100 MG tablet Take 100 mg by mouth daily.    . metoprolol tartrate (LOPRESSOR) 50 MG tablet Take 1 tablet (50 mg total) by mouth 2 (two) times daily. 60 tablet 11  . atorvastatin (LIPITOR) 40 MG tablet Take 1 tablet (40 mg total) by mouth daily. 30 tablet 11   No current facility-administered medications for this visit.     Past Medical History  Diagnosis Date  . Hypertension   . Asthma   . Diverticulitis   . COPD (chronic obstructive pulmonary disease) [facility])     Past Surgical History  Procedure Laterality Date  . Total knee arthroplasty Right   . Shoulder surgery    . Appendectomy    . Mandible surgery    . Left heart catheterization with coronary angiogram N/A 03/22/2014    Procedure: LEFT HEART CATHETERIZATION WITH CORONARY ANGIOGRAM;  Surgeon: Micheline Chapman, MD;  Location: [facility] CATH LAB;  Service: Cardiovascular;  Laterality: N/A;    Social History   Social History  . Marital Status: Single    Spouse Name: N/A  . Number of Children: 4  . Years of Education: N/A   Occupational History  . Not on file.   Social History Main  Topics  . Smoking status: Current Every Day Smoker  . Smokeless tobacco: Not on file  . Alcohol Use: No  . Drug Use: No  . Sexual Activity: Not on file   Other Topics Concern  . Not on file   Social History Narrative    Family History  Problem Relation Age of Onset  . Heart attack Father   . Stroke Father     3    ROS: no fevers or chills, productive cough, hemoptysis, dysphasia, odynophagia, melena, hematochezia, dysuria, hematuria, rash, seizure activity, orthopnea, PND, pedal edema, claudication. Remaining systems are negative.  Physical Exam: Well-developed well-nourished in no acute distress.  Skin is warm and dry.  HEENT is normal.  Neck is supple.  Chest is clear to auscultation with normal expansion.  Cardiovascular exam is regular rate and rhythm.  Abdominal exam nontender or distended. No masses palpated. Extremities show no edema. neuro grossly intact  ECG Sinus rhythm at a rate of 81. No ST changes.

## 2015-07-24 ENCOUNTER — Ambulatory Visit (INDEPENDENT_AMBULATORY_CARE_PROVIDER_SITE_OTHER): Payer: No Typology Code available for payment source | Admitting: Cardiology

## 2015-07-24 ENCOUNTER — Encounter: Payer: Self-pay | Admitting: Cardiology

## 2015-07-24 VITALS — BP 147/96 | HR 81 | Ht 70.0 in | Wt 267.4 lb

## 2015-07-24 DIAGNOSIS — I471 Supraventricular tachycardia, unspecified: Secondary | ICD-10-CM

## 2015-07-24 DIAGNOSIS — E785 Hyperlipidemia, unspecified: Secondary | ICD-10-CM

## 2015-07-24 DIAGNOSIS — Z720 Tobacco use: Secondary | ICD-10-CM

## 2015-07-24 DIAGNOSIS — I10 Essential (primary) hypertension: Secondary | ICD-10-CM

## 2015-07-24 DIAGNOSIS — I2510 Atherosclerotic heart disease of native coronary artery without angina pectoris: Secondary | ICD-10-CM

## 2015-07-24 DIAGNOSIS — I2583 Coronary atherosclerosis due to lipid rich plaque: Secondary | ICD-10-CM

## 2015-07-24 MED ORDER — ATORVASTATIN CALCIUM 40 MG PO TABS: 40.0000 mg | ORAL_TABLET | Freq: Every day | ORAL | Status: AC

## 2015-07-24 MED ORDER — METOPROLOL TARTRATE 50 MG PO TABS: 50.0000 mg | ORAL_TABLET | Freq: Two times a day (BID) | ORAL | Status: DC

## 2015-07-24 NOTE — Assessment & Plan Note (Signed)
Blood pressure elevated. Increase metoprolol to 50 mg twice a day. Continue Cozaar. Check potassium and renal function.

## 2015-07-24 NOTE — Assessment & Plan Note (Signed)
Brief flutters but no sustained palpitations. Increase metoprololTo 50 mg twice a day.

## 2015-07-24 NOTE — Assessment & Plan Note (Signed)
Add Lipitor 40 mg daily. Check lipids and liver in 4 weeks. 

## 2015-07-24 NOTE — Assessment & Plan Note (Signed)
Minor nonobstructive coronary disease. Continue aspirin. Resume statin.

## 2015-07-24 NOTE — Patient Instructions (Signed)
Medication Instructions:   INCREASE METOPROLOL TO 50 MG TWICE DAILY  START ATORVASTATIN 40 MG ONCE DAILY  Labwork:  Your physician recommends that you return for lab work in: 4 WEEKS= DO NOT EAT PRIOR TO LAB WORK  Follow-Up:  Your physician wants you to follow-up in: ONE YEAR WITH DR Shelda Pal will receive a reminder letter in the mail two months in advance. If you don't receive a letter, please call our office to schedule the follow-up appointment.   If you need a refill on your cardiac medications before your next appointment, please call your pharmacy.

## 2015-07-24 NOTE — Assessment & Plan Note (Signed)
Patient counseled on discontinuing. 

## 2015-08-24 LAB — LIPID PANEL
CHOL/HDL RATIO: 3.4 ratio (ref <=5.0)
CHOLESTEROL: 110 mg/dL — AB (ref 125–200)
HDL: 32 mg/dL — ABNORMAL LOW (ref >=40)
LDL Cholesterol: 58 mg/dL (ref <130)
Triglycerides: 99 mg/dL (ref <150)
VLDL: 20 mg/dL (ref <30)

## 2015-08-24 LAB — BASIC METABOLIC PANEL
BUN: 13 mg/dL (ref 7–25)
CALCIUM: 9.6 mg/dL (ref 8.6–10.3)
CHLORIDE: 103 mmol/L (ref 98–110)
CO2: 30 mmol/L (ref 20–31)
Creat: 0.99 mg/dL (ref 0.60–1.35)
Glucose, Bld: 96 mg/dL (ref 65–99)
Potassium: 4 mmol/L (ref 3.5–5.3)
SODIUM: 137 mmol/L (ref 135–146)

## 2015-08-24 LAB — HEPATIC FUNCTION PANEL
ALBUMIN: 4.5 g/dL (ref 3.6–5.1)
ALT: 17 U/L (ref 9–46)
AST: 15 U/L (ref 10–40)
Alkaline Phosphatase: 49 U/L (ref 40–115)
BILIRUBIN TOTAL: 0.4 mg/dL (ref 0.2–1.2)
Bilirubin, Direct: 0.1 mg/dL (ref <=0.2)
Indirect Bilirubin: 0.3 mg/dL (ref 0.2–1.2)
TOTAL PROTEIN: 7.8 g/dL (ref 6.1–8.1)

## 2015-08-26 ENCOUNTER — Encounter: Payer: Self-pay | Admitting: *Deleted

## 2016-09-02 ENCOUNTER — Other Ambulatory Visit: Payer: Self-pay | Admitting: Cardiology

## 2016-09-02 DIAGNOSIS — I471 Supraventricular tachycardia: Secondary | ICD-10-CM

## 2016-09-02 DIAGNOSIS — I10 Essential (primary) hypertension: Secondary | ICD-10-CM

## 2016-09-02 DIAGNOSIS — Z720 Tobacco use: Secondary | ICD-10-CM

## 2016-09-02 DIAGNOSIS — I2510 Atherosclerotic heart disease of native coronary artery without angina pectoris: Secondary | ICD-10-CM

## 2016-09-02 DIAGNOSIS — I2583 Coronary atherosclerosis due to lipid rich plaque: Principal | ICD-10-CM

## 2016-09-02 DIAGNOSIS — E785 Hyperlipidemia, unspecified: Secondary | ICD-10-CM

## 2016-09-02 NOTE — Telephone Encounter (Signed)
Rx(s) sent to pharmacy electronically.  

## 2017-11-04 ENCOUNTER — Ambulatory Visit: Payer: Self-pay | Admitting: Family Medicine

## 2020-12-31 ENCOUNTER — Other Ambulatory Visit: Payer: Self-pay

## 2020-12-31 ENCOUNTER — Encounter (HOSPITAL_BASED_OUTPATIENT_CLINIC_OR_DEPARTMENT_OTHER): Payer: Self-pay | Admitting: *Deleted

## 2020-12-31 ENCOUNTER — Emergency Department (HOSPITAL_BASED_OUTPATIENT_CLINIC_OR_DEPARTMENT_OTHER)
Admission: EM | Admit: 2020-12-31 | Discharge: 2020-12-31 | Disposition: A | Discharge status: Home / Self Care | Payer: No Typology Code available for payment source | Attending: Emergency Medicine | Admitting: Emergency Medicine

## 2020-12-31 DIAGNOSIS — J45909 Unspecified asthma, uncomplicated: Secondary | ICD-10-CM | POA: Insufficient documentation

## 2020-12-31 DIAGNOSIS — Z79899 Other long term (current) drug therapy: Secondary | ICD-10-CM | POA: Insufficient documentation

## 2020-12-31 DIAGNOSIS — I10 Essential (primary) hypertension: Secondary | ICD-10-CM | POA: Insufficient documentation

## 2020-12-31 DIAGNOSIS — F17200 Nicotine dependence, unspecified, uncomplicated: Secondary | ICD-10-CM | POA: Insufficient documentation

## 2020-12-31 DIAGNOSIS — I2510 Atherosclerotic heart disease of native coronary artery without angina pectoris: Secondary | ICD-10-CM | POA: Insufficient documentation

## 2020-12-31 DIAGNOSIS — J449 Chronic obstructive pulmonary disease, unspecified: Secondary | ICD-10-CM | POA: Insufficient documentation

## 2020-12-31 DIAGNOSIS — Z96651 Presence of right artificial knee joint: Secondary | ICD-10-CM | POA: Insufficient documentation

## 2020-12-31 DIAGNOSIS — Z7982 Long term (current) use of aspirin: Secondary | ICD-10-CM | POA: Insufficient documentation

## 2020-12-31 DIAGNOSIS — Z7901 Long term (current) use of anticoagulants: Secondary | ICD-10-CM | POA: Insufficient documentation

## 2020-12-31 DIAGNOSIS — U071 COVID-19: Secondary | ICD-10-CM | POA: Insufficient documentation

## 2020-12-31 HISTORY — DX: Atherosclerotic heart disease of native coronary artery without angina pectoris: I25.10

## 2020-12-31 LAB — RESP PANEL BY RT-PCR (FLU A&B, COVID) ARPGX2
Influenza A by PCR: NEGATIVE
Influenza B by PCR: NEGATIVE
SARS Coronavirus 2 by RT PCR: POSITIVE — AB

## 2020-12-31 MED ORDER — MOLNUPIRAVIR EUA 200MG CAPSULE: 4.0000 | ORAL_CAPSULE | Freq: Two times a day (BID) | ORAL | 0 refills | Status: AC

## 2020-12-31 NOTE — ED Triage Notes (Signed)
Headache, body aches, and cough x 2 days. His son had a Covid exposure at work and has the same symptoms.

## 2020-12-31 NOTE — Discharge Instructions (Addendum)
Your COVID test has returned POSITIVE today. It is recommended that you self isolate for the next 5 days (cleared: 07/18).   Pick up medication and take as prescribed to prevent worsening infection given your past medical history puts you at increased risk for complications.   Follow up with your PCP regarding ED visit today  Return to the ED IMMEDIATELY for any worsening symptoms including shortness of breath, severe chest pain, lips/fingers turning blue, inability to awaken easily, new onset confusion, or any other new/concerning symptoms

## 2020-12-31 NOTE — ED Provider Notes (Signed)
[facility] EMERGENCY DEPARTMENT Provider Note   CSN: [id number] Arrival date & time: 12/31/20  1150     History Chief Complaint  Patient presents with   Covid Exposure    Adam Moses is a 50 y.o. male with Pmhx HTN, Asthma, CAD, COPD, who presents to the ED today with complaint of gradual onset, constant, body aches for the past 2 days. Pt also complains of headache, cough, subjective fevers, and chills. Pt states that yesterday he was wrapped in a blanket and was sweating and since that time has felt better however continues to have body aches. Pt was convinced he likely has a 24 hour bug however his son is in the ED today with similar symptoms after COVID exposure. Pt is vaccinated x 2, no booster. No other complaints at this time.   The history is provided by the patient and medical records.      Past Medical History:  Diagnosis Date   Asthma    COPD (chronic obstructive pulmonary disease) (HCC)    Coronary artery disease    Diverticulitis    Hypertension     Patient Active Problem List   Diagnosis Date Noted   CAD (coronary artery disease) 04/11/2014   SVT (supraventricular tachycardia) (HCC) 04/11/2014   Hyperlipidemia 04/11/2014   Cardiomyopathy (HCC) 02/28/2014   Chest pain 01/31/2014   Tobacco abuse 01/31/2014   Morbid obesity (HCC) 01/31/2014   Essential hypertension 01/31/2014   Palpitations 01/31/2014    Past Surgical History:  Procedure Laterality Date   APPENDECTOMY     LEFT HEART CATHETERIZATION WITH CORONARY ANGIOGRAM N/A 03/22/2014   Procedure: LEFT HEART CATHETERIZATION WITH CORONARY ANGIOGRAM;  Surgeon: Micheline Chapman, MD;  Location: [facility] CATH LAB;  Service: Cardiovascular;  Laterality: N/A;   MANDIBLE SURGERY     SHOULDER SURGERY     TOTAL KNEE ARTHROPLASTY Right        Family History  Problem Relation Age of Onset   Heart attack Father    Stroke Father        3    Social History   Tobacco Use   Smoking status: Every  Day    Pack years: 0.00   Smokeless tobacco: Never  Vaping Use   Vaping Use: Never used  Substance Use Topics   Alcohol use: Yes   Drug use: No    Home Medications Prior to Admission medications   Medication Sig Start Date End Date Taking? Authorizing Provider  apixaban (ELIQUIS) 5 MG TABS tablet Take by mouth. 11/19/20 02/17/21 Yes [provider]  aspirin EC 81 MG tablet Take 81 mg by mouth daily.   Yes [provider]  digoxin (LANOXIN) 0.125 MG tablet Take 1 tablet by mouth daily. 11/20/20 02/18/21 Yes [provider]  folic acid (FOLVITE) 800 MCG tablet Take by mouth. 11/20/20 02/18/21 Yes [provider]  magnesium oxide (MAG-OX) 400 MG tablet Take by mouth. 11/19/20  Yes [provider]  metoprolol (LOPRESSOR) 50 MG tablet Take 1 tablet (50 mg total) by mouth 2 (two) times daily. NEEDS APPOINTMENT FOR FUTURE REFILLS 09/02/16  Yes Lewayne Bunting, MD  molnupiravir EUA 200 mg CAPS Take 4 capsules (800 mg total) by mouth 2 (two) times daily for 5 days. 12/31/20 01/05/21 Yes Ishaaq Penna, PA-C  pantoprazole (PROTONIX) 40 MG tablet Take by mouth.   Yes [provider]  potassium chloride SA (KLOR-CON) 20 MEQ tablet Take 1 tablet by mouth daily. 09/11/20  Yes [provider]  spironolactone (ALDACTONE) 25 MG tablet Take 1 tablet by mouth daily. 11/20/20 02/18/21 Yes [provider]  thiamine 100 MG tablet Take 1 tablet by mouth daily. 11/20/20 02/18/21 Yes [provider]  torsemide (DEMADEX) 20 MG tablet Take 1 tablet by mouth daily. 11/20/20 02/18/21 Yes [provider]  atorvastatin (LIPITOR) 40 MG tablet Take 1 tablet (40 mg total) by mouth daily. 04/11/14 04/11/15  Lewayne Bunting, MD  atorvastatin (LIPITOR) 40 MG tablet Take 1 tablet (40 mg total) by mouth daily. 07/24/15 07/23/16  Lewayne Bunting, MD  esomeprazole (NEXIUM) 20 MG capsule Take 40 mg by mouth daily at 12 noon.    [provider]   losartan (COZAAR) 100 MG tablet Take 100 mg by mouth daily.    [provider]    Allergies    Lisinopril  Review of Systems   Review of Systems  Constitutional:  Positive for chills, fatigue and fever.  Respiratory:  Positive for cough. Negative for shortness of breath.   Cardiovascular:  Negative for chest pain.  Gastrointestinal:  Negative for abdominal pain, diarrhea, nausea and vomiting.  Musculoskeletal:  Positive for myalgias.  Neurological:  Positive for headaches.  All other systems reviewed and are negative.  Physical Exam Updated Vital Signs BP (!) 137/91 (BP Location: Right Arm)   Pulse (!) 107   Temp 98.5 F (36.9 C) (Oral)   Resp 19   Ht 5\' 11"  (1.803 m)   Wt 96.2 kg   SpO2 100%   BMI 29.57 kg/m   Physical Exam Vitals and nursing note reviewed.  Constitutional:      Appearance: He is not ill-appearing or diaphoretic.  HENT:     Head: Normocephalic and atraumatic.  Eyes:     Conjunctiva/sclera: Conjunctivae normal.  Cardiovascular:     Rate and Rhythm: Normal rate and regular rhythm.     Pulses: Normal pulses.  Pulmonary:     Effort: Pulmonary effort is normal.     Breath sounds: Normal breath sounds. No wheezing, rhonchi or rales.  Skin:    General: Skin is warm and dry.     Coloration: Skin is not jaundiced.  Neurological:     Mental Status: He is alert.    ED Results / Procedures / Treatments   Labs (all labs ordered are listed, but only abnormal results are displayed) Labs Reviewed  RESP PANEL BY RT-PCR (FLU A&B, COVID) ARPGX2 - Abnormal; Notable for the following components:      Result Value   SARS Coronavirus 2 by RT PCR POSITIVE (*)    All other components within normal limits    EKG None  Radiology No results found.  Procedures Procedures   Medications Ordered in ED Medications - No data to display  ED Course  I have reviewed the triage vital signs and the nursing notes.  Pertinent labs & imaging results that  were available during my care of the patient were reviewed by me and considered in my medical decision making (see chart for details).    MDM Rules/Calculators/A&P                          50 year old male who presents to the ED today with complaint of cough, body aches, fevers, chills, headache for the past 2 days.  He continues to have body aches however the rest of his symptoms have dissipated.  He is vaccinated x2.  His son is in the ED with  similar symptoms at this time after positive COVID exposure at work.  On arrival to the ED today patient is afebrile, mildly tachycardic at 107, nontachypneic, satting 100% on room air.  Tachycardia is dissipated while he is being evaluated.  He did use his inhaler earlier today, suspect that is why he was slightly tachycardic on arrival.  On my exam physical exam is reassuring today.  COVID test pending.  Given past medical history with significant comorbidities will await COVID test at this time and likely prescribe oral antiviral medication for patient.   COVID test positive at this time. Will discharge with molnupiravir at this time and close PCP follow up. Pt in agreement with plan and stable for discharge home.   This note was prepared using Dragon voice recognition software and may include unintentional dictation errors due to the inherent limitations of voice recognition software.  Margit Banda was evaluated in Emergency Department on 12/31/2020 for the symptoms described in the history of present illness. He was evaluated in the context of the global COVID-19 pandemic, which necessitated consideration that the patient might be at risk for infection with the SARS-CoV-2 virus that causes COVID-19. Institutional protocols and algorithms that pertain to the evaluation of patients at risk for COVID-19 are in a state of rapid change based on information released by regulatory bodies including the CDC and federal and state organizations. These policies and  algorithms were followed during the patient's care in the ED.   Final Clinical Impression(s) / ED Diagnoses Final diagnoses:  COVID-19    Rx / DC Orders ED Discharge Orders          Ordered    molnupiravir EUA 200 mg CAPS  2 times daily        12/31/20 1407             Discharge Instructions      Your COVID test has returned POSITIVE today. It is recommended that you self isolate for the next 5 days (cleared: 07/18).   Pick up medication and take as prescribed to prevent worsening infection given your past medical history puts you at increased risk for complications.   Follow up with your PCP regarding ED visit today  Return to the ED IMMEDIATELY for any worsening symptoms including shortness of breath, severe chest pain, lips/fingers turning blue, inability to awaken easily, new onset confusion, or any other new/concerning symptoms       Tanda Rockers, PA-C 12/31/20 1409    Pollyann Savoy, MD 12/31/20 1425

## 2022-07-10 ENCOUNTER — Encounter (HOSPITAL_COMMUNITY): Payer: Self-pay | Admitting: Emergency Medicine

## 2022-07-10 ENCOUNTER — Emergency Department (HOSPITAL_COMMUNITY): Payer: 59

## 2022-07-10 ENCOUNTER — Emergency Department (HOSPITAL_COMMUNITY)
Admission: EM | Admit: 2022-07-10 | Discharge: 2022-07-11 | Disposition: A | Discharge status: Home / Self Care | Payer: 59 | Attending: Emergency Medicine | Admitting: Emergency Medicine

## 2022-07-10 DIAGNOSIS — Z4801 Encounter for change or removal of surgical wound dressing: Secondary | ICD-10-CM | POA: Diagnosis present

## 2022-07-10 DIAGNOSIS — Z5189 Encounter for other specified aftercare: Secondary | ICD-10-CM

## 2022-07-10 LAB — COMPREHENSIVE METABOLIC PANEL
ALT: 18 U/L (ref 0–44)
AST: 22 U/L (ref 15–41)
Albumin: 2.3 g/dL — ABNORMAL LOW (ref 3.5–5.0)
Alkaline Phosphatase: 84 U/L (ref 38–126)
Anion gap: 9 (ref 5–15)
BUN: 5 mg/dL — ABNORMAL LOW (ref 6–20)
CO2: 27 mmol/L (ref 22–32)
Calcium: 8.4 mg/dL — ABNORMAL LOW (ref 8.9–10.3)
Chloride: 100 mmol/L (ref 98–111)
Creatinine, Ser: 0.6 mg/dL — ABNORMAL LOW (ref 0.61–1.24)
GFR, Estimated: >60 mL/min (ref >60)
Glucose, Bld: 113 mg/dL — ABNORMAL HIGH (ref 70–99)
Potassium: 3.7 mmol/L (ref 3.5–5.1)
Sodium: 136 mmol/L (ref 135–145)
Total Bilirubin: 0.5 mg/dL (ref 0.3–1.2)
Total Protein: 7.4 g/dL (ref 6.5–8.1)

## 2022-07-10 LAB — CBC
HCT: 31.1 % — ABNORMAL LOW (ref 39.0–52.0)
Hemoglobin: 9.7 g/dL — ABNORMAL LOW (ref 13.0–17.0)
MCH: 27.9 pg (ref 26.0–34.0)
MCHC: 31.2 g/dL (ref 30.0–36.0)
MCV: 89.4 fL (ref 80.0–100.0)
Platelets: 240 10*3/uL (ref 150–400)
RBC: 3.48 MIL/uL — ABNORMAL LOW (ref 4.22–5.81)
RDW: 19.4 % — ABNORMAL HIGH (ref 11.5–15.5)
WBC: 6.1 10*3/uL (ref 4.0–10.5)
nRBC: 0 % (ref 0.0–0.2)

## 2022-07-10 NOTE — ED Provider Triage Note (Signed)
Emergency Medicine Provider Triage Evaluation Note  Adam Moses , a 52 y.o. male  was evaluated in triage.  Pt complains of abdominal pain, bleeding from G-tube this morning.  Patient is at a rehab facility, he cannot tell me why, reports that he had some kind of a cardiac event and was in a coma, he does not recall when the G-tube was placed, and reports that it is never been changed.  Patient denies any trauma to the abdomen, does have some tenderness surrounding the G-tube.  There is no active bleeding at this time.  Review of Systems  Positive: Abdominal pain, g tube issue Negative: Fever, chills, nausea, vomiting  Physical Exam  BP 121/64   Pulse 97   Temp 98.2 F (36.8 C) (Oral)   Resp 18   SpO2 98%  Gen:   Awake, no distress   Resp:  Normal effort  MSK:   Moves extremities without difficulty  Other:  G tube in place, no active bleeding, abdomen generally tender in surrounding tissue  Medical Decision Making  Medically screening exam initiated at 5:44 PM.  Appropriate orders placed.  Cyndia Bent was informed that the remainder of the evaluation will be completed by another provider, this initial triage assessment does not replace that evaluation, and the importance of remaining in the ED until their evaluation is complete.  Workup initiated in triage    Anselmo Pickler, [place] 07/10/22 1748

## 2022-07-10 NOTE — ED Triage Notes (Signed)
Pt here from [place] with a g tube that was bleeding around the site ealier today , no bleeding now , had a x ray done but no ones knows the results

## 2022-07-11 NOTE — Discharge Instructions (Signed)
It's important to keep some gauze between the plastic and your skin to prevent ulceration.  Please follow-up with your doctor.

## 2022-07-11 NOTE — ED Provider Notes (Signed)
[facility] Emergency Department Provider Note MRN:  [id number]  Arrival date & time: 07/11/22     Chief Complaint   Wound check  History of Present Illness   Adam Moses is a 52 y.o. year-old male presents to the ED with chief complaint of bleeding around his ostomy.  He states that he noticed some bleeding this morning.  He is no longer bleeding now.  Denies any dysfunction with his g-tube.  Denies an pain.  Denies treatment PTA.  History provided by patient.   Review of Systems  Pertinent positive and negative review of systems noted in HPI.    Physical Exam   Vitals:   07/10/22 1729 07/11/22 0150  BP: 121/64 (!) 150/71  Pulse: 97 87  Resp: 18   Temp: 98.2 F (36.8 C)   SpO2: 98% 100%    CONSTITUTIONAL:  well-appearing, NAD NEURO:  Alert and oriented x 3, CN 3-12 grossly intact EYES:  eyes equal and reactive ENT/NECK:  Supple, no stridor  CARDIO:  normal rate, appears well-perfused  PULM:  No respiratory distress,  GI/GU:  non-distended, g-tube in place, there is a couple of small ulcers in the skin where the plastic rim of the external g-tube has likely been rubbing.  This appears to be the source of the bleeding. MSK/SPINE:  No gross deformities, no edema, moves all extremities  SKIN:  as above   *Additional and/or pertinent findings included in MDM below  Diagnostic and Interventional Summary    EKG Interpretation  Date/Time:    Ventricular Rate:    PR Interval:    QRS Duration:   QT Interval:    QTC Calculation:   R Axis:     Text Interpretation:         Labs Reviewed  CBC - Abnormal; Notable for the following components:      Result Value   RBC 3.48 (*)    Hemoglobin 9.7 (*)    HCT 31.1 (*)    RDW 19.4 (*)    All other components within normal limits  COMPREHENSIVE METABOLIC PANEL - Abnormal; Notable for the following components:   Glucose, Bld 113 (*)    BUN 5 (*)    Creatinine, Ser 0.60 (*)    Calcium 8.4  (*)    Albumin 2.3 (*)    All other components within normal limits  URINALYSIS, ROUTINE W REFLEX MICROSCOPIC    DG Abdomen 1 View  Final Result      Medications - No data to display   Procedures  /  Critical Care Procedures  ED Course and Medical Decision Making  I have reviewed the triage vital signs, the nursing notes, and pertinent available records from the EMR.  Social Determinants Affecting Complexity of Care: Patient has no clinically significant social determinants affecting this chief complaint..   ED Course:    Medical Decision Making HGB is 9.7, but last to compare to was 8 year ago.  He doesn't have any worrisome vitals.  I doubt this is acute.  The bleeding most definitely seems to be from the skin ulcers around the g-tube.  We have bandaged these.  Workup is otherwise reassuring.  I don't think that he needs any additional workup tonight and patient is requesting to be discharged.  Will have him follow-up outpatient.  He is agreeable with this plan.  Amount and/or Complexity of Data Reviewed Radiology: independent interpretation performed.    Details: No free air  Consultants: No consultations were needed in caring for this patient.   Treatment and Plan: Emergency department workup does not suggest an emergent condition requiring admission or immediate intervention beyond  what has been performed at this time. The patient is safe for discharge and has  been instructed to return immediately for worsening symptoms, change in  symptoms or any other concerns    Final Clinical Impressions(s) / ED Diagnoses     ICD-10-CM   1. Visit for wound check  Z51.89       ED Discharge Orders     None         Discharge Instructions Discussed with and Provided to Patient:     Discharge Instructions      It's important to keep some gauze between the plastic and your skin to prevent ulceration.  Please follow-up with your doctor.       Montine Circle, PA-C 07/11/22 0153    Fatima Blank, MD 07/11/22 [phone]

## 2022-07-11 NOTE — ED Notes (Signed)
Attempted to call and give report to nursing facility with no answer

## 2024-04-09 ENCOUNTER — Emergency Department (HOSPITAL_BASED_OUTPATIENT_CLINIC_OR_DEPARTMENT_OTHER)
Admission: EM | Admit: 2024-04-09 | Discharge: 2024-04-09 | Discharge status: Against Medical Advice | Source: Home / Self Care
